# Patient Record
Sex: Female | Born: 1995 | Race: White | Hispanic: No | Marital: Single | Smoking: Never smoker
Health system: Southern US, Community
[De-identification: ages and names within clinical notes are randomized; demographics above are authoritative.]

## PROBLEM LIST (undated history)

## (undated) HISTORY — PX: WISDOM TOOTH EXTRACTION: SHX21

---

## 2017-02-23 ENCOUNTER — Ambulatory Visit: Admission: RE | Admit: 2017-02-23 | Payer: Self-pay | Source: Ambulatory Visit | Admitting: *Deleted

## 2017-02-23 ENCOUNTER — Other Ambulatory Visit: Payer: Self-pay

## 2017-02-23 ENCOUNTER — Encounter: Payer: Self-pay | Admitting: Emergency Medicine

## 2017-02-23 ENCOUNTER — Emergency Department: Payer: BLUE CROSS/BLUE SHIELD

## 2017-02-23 ENCOUNTER — Emergency Department
Admission: EM | Admit: 2017-02-23 | Discharge: 2017-02-23 | Disposition: A | Discharge status: Home / Self Care | Payer: BLUE CROSS/BLUE SHIELD | Attending: Emergency Medicine | Admitting: Emergency Medicine

## 2017-02-23 DIAGNOSIS — R1031 Right lower quadrant pain: Secondary | ICD-10-CM

## 2017-02-23 DIAGNOSIS — K529 Noninfective gastroenteritis and colitis, unspecified: Secondary | ICD-10-CM

## 2017-02-23 LAB — CBC
HCT: 41.2 % (ref 35.0–47.0)
HEMOGLOBIN: 13.8 g/dL (ref 12.0–16.0)
MCH: 30.2 pg (ref 26.0–34.0)
MCHC: 33.5 g/dL (ref 32.0–36.0)
MCV: 90 fL (ref 80.0–100.0)
PLATELETS: 274 10*3/uL (ref 150–440)
RBC: 4.58 MIL/uL (ref 3.80–5.20)
RDW: 13.8 % (ref 11.5–14.5)
WBC: 11.7 10*3/uL — ABNORMAL HIGH (ref 3.6–11.0)

## 2017-02-23 LAB — URINALYSIS, COMPLETE (UACMP) WITH MICROSCOPIC
Bilirubin Urine: NEGATIVE
Glucose, UA: NEGATIVE mg/dL
Ketones, ur: NEGATIVE mg/dL
Nitrite: NEGATIVE
PROTEIN: NEGATIVE mg/dL
SPECIFIC GRAVITY, URINE: 1.005 (ref 1.005–1.030)
pH: 8 (ref 5.0–8.0)

## 2017-02-23 LAB — COMPREHENSIVE METABOLIC PANEL
ALBUMIN: 4.7 g/dL (ref 3.5–5.0)
ALK PHOS: 77 U/L (ref 38–126)
ALT: 10 U/L — AB (ref 14–54)
AST: 20 U/L (ref 15–41)
Anion gap: 11 (ref 5–15)
BUN: 9 mg/dL (ref 6–20)
CALCIUM: 9.7 mg/dL (ref 8.9–10.3)
CHLORIDE: 102 mmol/L (ref 101–111)
CO2: 24 mmol/L (ref 22–32)
CREATININE: 0.37 mg/dL — AB (ref 0.44–1.00)
GFR calc non Af Amer: >60 mL/min (ref >60)
GLUCOSE: 86 mg/dL (ref 65–99)
Potassium: 3.8 mmol/L (ref 3.5–5.1)
SODIUM: 137 mmol/L (ref 135–145)
Total Bilirubin: 0.8 mg/dL (ref 0.3–1.2)
Total Protein: 7.7 g/dL (ref 6.5–8.1)

## 2017-02-23 LAB — LIPASE, BLOOD: Lipase: 31 U/L (ref 11–51)

## 2017-02-23 LAB — POCT PREGNANCY, URINE: PREG TEST UR: NEGATIVE

## 2017-02-23 MED ORDER — ONDANSETRON HCL 4 MG PO TABS: 4.0000 mg | ORAL_TABLET | Freq: Three times a day (TID) | ORAL | 0 refills | Status: AC | PRN

## 2017-02-23 MED ORDER — PIPERACILLIN-TAZOBACTAM 3.375 G IVPB
INTRAVENOUS | Status: AC
  Administered 2017-02-23: 3.375 g via INTRAVENOUS
  Filled 2017-02-23: qty 50

## 2017-02-23 MED ORDER — METRONIDAZOLE 500 MG PO TABS: 500.0000 mg | ORAL_TABLET | Freq: Three times a day (TID) | ORAL | 0 refills | Status: AC

## 2017-02-23 MED ORDER — MORPHINE SULFATE (PF) 2 MG/ML IV SOLN
2.0000 mg | Freq: Once | INTRAVENOUS | Status: DC
  Filled 2017-02-23: qty 1

## 2017-02-23 MED ORDER — KETOROLAC TROMETHAMINE 30 MG/ML IJ SOLN
INTRAMUSCULAR | Status: AC
  Administered 2017-02-23: 15 mg via INTRAVENOUS
  Filled 2017-02-23: qty 1

## 2017-02-23 MED ORDER — IOPAMIDOL (ISOVUE-300) INJECTION 61%
30.0000 mL | Freq: Once | INTRAVENOUS | Status: AC | PRN
  Administered 2017-02-23: 30 mL via ORAL
  Filled 2017-02-23: qty 30

## 2017-02-23 MED ORDER — ONDANSETRON HCL 4 MG/2ML IJ SOLN
4.0000 mg | Freq: Once | INTRAMUSCULAR | Status: AC
  Administered 2017-02-23: 4 mg via INTRAVENOUS
  Filled 2017-02-23: qty 2

## 2017-02-23 MED ORDER — PIPERACILLIN-TAZOBACTAM 3.375 G IVPB 30 MIN
3.3750 g | Freq: Once | INTRAVENOUS | Status: AC
  Administered 2017-02-23: 3.375 g via INTRAVENOUS

## 2017-02-23 MED ORDER — SODIUM CHLORIDE 0.9 % IV BOLUS (SEPSIS)
1000.0000 mL | Freq: Once | INTRAVENOUS | Status: AC
  Administered 2017-02-23: 1000 mL via INTRAVENOUS

## 2017-02-23 MED ORDER — KETOROLAC TROMETHAMINE 30 MG/ML IJ SOLN
15.0000 mg | Freq: Once | INTRAMUSCULAR | Status: AC
  Administered 2017-02-23: 15 mg via INTRAVENOUS

## 2017-02-23 MED ORDER — IOPAMIDOL (ISOVUE-300) INJECTION 61%
75.0000 mL | Freq: Once | INTRAVENOUS | Status: AC | PRN
Start: 2017-02-23 — End: 2017-02-23
  Administered 2017-02-23: 75 mL via INTRAVENOUS
  Filled 2017-02-23: qty 75

## 2017-02-23 MED ORDER — AMOXICILLIN-POT CLAVULANATE 875-125 MG PO TABS: 1.0000 | ORAL_TABLET | Freq: Two times a day (BID) | ORAL | 0 refills | Status: AC

## 2017-02-23 NOTE — ED Notes (Signed)
Pt reports pain is still "not bad enough to need morphine." Will continue to monitor.

## 2017-02-23 NOTE — ED Provider Notes (Addendum)
-----------------------------------------   5:47 PM on 02/23/2017 -----------------------------------------   Blood pressure 125/82, pulse 71, temperature 98.2 F (36.8 C), temperature source Oral, resp. rate 18, height 5\' 2"  (1.575 m), weight 49.9 kg (110 lb), last menstrual period 02/18/2017, SpO2 97 %.  Assuming care from Dr. Shaune PollackLord of [place] ParmaStephanie Reyes is a 22 y.o. female with a chief complaint of Abdominal Pain .    Please refer to H&P by previous MD for further details.  The current plan of care is to f/u results of CT scan.  CT Abdomen Pelvis W Contrast (Final result)  Result time 02/23/17 17:34:08  Final result by Oley BalmHassell, Daniel, MD (02/23/17 17:34:08)           Narrative:   CLINICAL DATA: Patient complains of RLQ pain since this AM. Mild nausea, pain mod 5/10 at rlq. Never had this before. No prior hx of ovarian cyst. No vaginal discharge. Period ended yesterday.  EXAM: CT ABDOMEN AND PELVIS WITH CONTRAST  TECHNIQUE: Multidetector CT imaging of the abdomen and pelvis was performed using the standard protocol following bolus administration of intravenous contrast.  CONTRAST: 75mL ISOVUE-300 IOPAMIDOL (ISOVUE-300) INJECTION 61%  COMPARISON: None.  FINDINGS: Lower chest: No acute abnormality.  Hepatobiliary: No focal liver abnormality is seen. No gallstones, gallbladder wall thickening, or biliary dilatation.  Pancreas: Unremarkable. No pancreatic ductal dilatation or surrounding inflammatory changes.  Spleen: Normal in size without focal abnormality.  Adrenals/Urinary Tract: Adrenal glands are unremarkable. Kidneys are normal, without renal calculi, focal lesion, or hydronephrosis. Bladder is unremarkable.  Stomach/Bowel: Stomach is incompletely distended. Small bowel is nondilated. Terminal ileum unremarkable. Normal appendix. There is eccentric wall thickening in the proximal ascending colon with some surrounding inflammatory/edematous change. No  evidence of abscess. The remainder of the colon is unremarkable.  Vascular/Lymphatic: No significant vascular findings are present. No enlarged abdominal or pelvic lymph nodes.  Reproductive: Uterus and bilateral adnexa are unremarkable.  Other: Trace pelvic ascites probably physiologic. No free air.  Musculoskeletal: No acute or significant osseous findings.  IMPRESSION: 1. Focal eccentric wall thickening involving proximal ascending colon with adjacent inflammatory/edematous change. Considerations include inflammatory bowel disease, infectious/inflammatory process, less likely neoplasm. Consider colonoscopy if symptoms persist. 2. Normal appendix.         Patient denies personal or family history of IBD or colon cancer. Labs with mild leukocytosis, vitals WNL. Patient's pain 4/10. Will give zosyn and dc on flagyl and augmentin PO with close f/u with PCP. Patient being referred to GI for further evaluation. Return precautions for worsening signs of infection have been discussed with patient. Patient reports that she is going on a road trip tomorrow. I explained to her that if those symptoms develop she needs to seek emergent care at the closest ER. Patient understands instructions and is comfortable with plan.          Don PerkingVeronese, [place], MD 02/23/17 1751    Nita SickleVeronese, [place], MD 02/23/17 [phone]

## 2017-02-23 NOTE — ED Notes (Signed)
ED Provider at bedside. 

## 2017-02-23 NOTE — ED Notes (Signed)
Patient transported to CT 

## 2017-02-23 NOTE — Discharge Instructions (Signed)
Your CT scan showed inflammation of your colon which could be due to infection, a inflammatory process such as Crohns or ulcerative colitis, or less likely cancer. Please take the antibiotics prescribed fully even if your symptoms resolve in a few days. You need to follow up with a GI doctor for further evaluation of this inflamed area. I am giving you the contact of Dr. Maximino Greenlandahiliani. You need an appointment with her after you finished the course of antibiotics.  Return to the ER for worsening pain, nausea, vomiting, fever, or if your symptoms are not improving after 48 hours of antibiotics.

## 2017-02-23 NOTE — ED Triage Notes (Signed)
Lower R abd pain since this am.

## 2017-02-23 NOTE — ED Provider Notes (Signed)
[facility] Emergency Department Provider Note ____________________________________________   I have reviewed the triage vital signs and the triage nursing note.  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  Mild nausea, pain mod 5/10 at rlq.  Never had this before.  No prior hx of ovarian cyst.  No vaginal discharge.  Period ended yesterday.  No fever. Mild nausea without vomiting.  No diarrhea.  Movement seems to make it worse.    History reviewed. No pertinent past medical history.  There are no active problems to display for this patient.   History reviewed. No pertinent surgical history.  Prior to Admission medications   Not on File    No Known Allergies  No family history on file.  Social History Social History   Tobacco Use  . Smoking status: Never Smoker  Substance Use Topics  . Alcohol use: Not on file  . Drug use: Not on file    Review of Systems  Constitutional: Negative for fever. Eyes: Negative for visual changes. ENT: Negative for sore throat. Cardiovascular: Negative for chest pain. Respiratory: Negative for shortness of breath. Gastrointestinal: Positive for right lower quadrant abdominal pain as per HPI.Marland Kitchen Genitourinary: Negative for dysuria. Musculoskeletal: Negative for back pain. Skin: Negative for rash. Neurological: Negative for headache.  ____________________________________________   PHYSICAL EXAM:  VITAL SIGNS: ED Triage Vitals  Enc Vitals Group     BP 02/23/17 1309 113/71     Pulse Rate 02/23/17 1309 77     Resp 02/23/17 1309 (!) 22     Temp 02/23/17 1309 97.9 F (36.6 C)     Temp Source 02/23/17 1309 Oral     SpO2 02/23/17 1309 100 %     Weight 02/23/17 1310 110 lb (49.9 kg)     Height 02/23/17 1310 5\' 2"  (1.575 m)     Head Circumference --      Peak Flow --    Pain Score 02/23/17 1328 5     Pain Loc --      Pain Edu? --      Excl. in GC? --      Constitutional: Alert and oriented. Well appearing and in no distress. HEENT   Head: Normocephalic and atraumatic.      Eyes: Conjunctivae are normal. Pupils equal and round.       Ears:         Nose: No congestion/rhinnorhea.   Mouth/Throat: Mucous membranes are moist.   Neck: No stridor. Cardiovascular/Chest: Normal rate, regular rhythm.  No murmurs, rubs, or gallops. Respiratory: Normal respiratory effort without tachypnea nor retractions. Breath sounds are clear and equal bilaterally. No wheezes/rales/rhonchi. Gastrointestinal: Soft. No distention, no rebound.  Moderate right lower quadrant tenderness to palpation with guarding. Genitourinary/rectal:Deferred Musculoskeletal: Nontender with normal range of motion in all extremities. No joint effusions.  No lower extremity tenderness.  No edema. Neurologic:  Normal speech and language. No gross or focal neurologic deficits are appreciated. Skin:  Skin is warm, dry and intact. No rash noted. Psychiatric: Mood and affect are normal. Speech and behavior are normal. Patient exhibits appropriate insight and judgment.   ____________________________________________  LABS (pertinent positives/negatives) I, Governor Rooks, MD the attending physician have reviewed the labs noted below.  Labs Reviewed  COMPREHENSIVE METABOLIC PANEL - Abnormal; Notable for the following components:      Result Value   Creatinine,  Ser 0.37 (*)    ALT 10 (*)    All other components within normal limits  CBC - Abnormal; Notable for the following components:   WBC 11.7 (*)    All other components within normal limits  URINALYSIS, COMPLETE (UACMP) WITH MICROSCOPIC - Abnormal; Notable for the following components:   Color, Urine STRAW (*)    APPearance CLEAR (*)    Hgb urine dipstick MODERATE (*)    Leukocytes, UA LARGE (*)    Bacteria, UA RARE (*)    Squamous  Epithelial / LPF 0-5 (*)    All other components within normal limits  URINE CULTURE  LIPASE, BLOOD  POC URINE PREG, ED  POCT PREGNANCY, URINE     ____________________________________________  RADIOLOGY All Xrays were viewed by me.  Imaging interpreted by Radiologist, and I, Governor Rooksebecca Dayelin Balducci, MD the attending physician have reviewed the radiologist interpretation noted below.  CT abdomen pelvis with contrast: Pending __________________________________________  PROCEDURES  Procedure(s) performed: None  Critical Care performed: None   ____________________________________________  ED COURSE / ASSESSMENT AND PLAN  Pertinent labs & imaging results that were available during my care of the patient were reviewed by me and considered in my medical decision making (see chart for details).   Highly suspicious clinically for acute appendicitis, discussed with patient, discussed risk versus benefit with radiation exposure for CT scan and chose to proceed for evaluation of acute appendicitis.  No vaginal complaints, holding off on pelvic exam given high suspicion for acute appendicitis.  Patient care transferred to Dr. Don PerkingVeronese, dispo pending ct result.  DIFFERENTIAL DIAGNOSIS: Differential diagnosis includes, but is not limited to, ovarian cyst, ovarian torsion, acute appendicitis, diverticulitis, urinary tract infection/pyelonephritis, endometriosis, bowel obstruction, colitis, renal colic, gastroenteritis, hernia, fibroids, endometriosis, pregnancy related pain including ectopic pregnancy, etc.    Patient / Family / Caregiver informed of clinical course, medical decision-making process, and agree with plan.   ___________________________________________   FINAL CLINICAL IMPRESSION(S) / ED DIAGNOSES   Final diagnoses:  RLQ abdominal pain      ___________________________________________        Note: This dictation was prepared with Dragon dictation. Any  transcriptional errors that result from this process are unintentional    Governor RooksLord, Nyana Haren, MD 02/23/17 [phone]

## 2017-02-23 NOTE — ED Notes (Signed)
Pt ambulatory upon discharge. Verbalized understanding of discharge instructions, follow-up care and prescriptions. VSS. Skin warm and dry. A&O x4.  

## 2017-02-25 LAB — URINE CULTURE: Culture: <10000 — AB

## 2017-03-09 ENCOUNTER — Ambulatory Visit (INDEPENDENT_AMBULATORY_CARE_PROVIDER_SITE_OTHER): Payer: BLUE CROSS/BLUE SHIELD | Admitting: Gastroenterology

## 2017-03-09 ENCOUNTER — Encounter: Payer: Self-pay | Admitting: Gastroenterology

## 2017-03-09 VITALS — BP 119/85 | HR 86 | Temp 98.1°F | Ht 62.0 in | Wt 114.0 lb

## 2017-03-09 DIAGNOSIS — R1031 Right lower quadrant pain: Secondary | ICD-10-CM

## 2017-03-09 DIAGNOSIS — R933 Abnormal findings on diagnostic imaging of other parts of digestive tract: Secondary | ICD-10-CM | POA: Diagnosis not present

## 2017-03-09 NOTE — Progress Notes (Signed)
Arlyss Repress, MD [address]  [address]  [place], [place] [address]  Main: [phone]  Fax: [phone]    Gastroenterology Consultation  Referring Provider:     No ref. provider found Primary Care Physician:  Patient, No Pcp Per Primary Gastroenterologist:  Dr. Arlyss Repress Reason for Consultation:     RLQ pain        HPI:   Teresa Reyes is a 22 y.o. female referred from ER for consultation & management of acute episode of RLQ pain. It was sudden onset,moderate in intensity, sharp and a/w nausea. Went to ER on 02/23/17. Mild rise in WBC count. Lipase, Hb, CMP, preg test normal. CT A/P with con revealed thickening of AC. No evidence of acute appendicitis. She was discharged on Augmentin and flagyl for 10days. She denies any problems with menstruation   NSAIDs: None  Antiplts/Anticoagulants/Anti thrombotics: None  GI Procedures: None Fam history of colon cancer in maternal 2nd degree and 3rd degree relatives.  No abdominal surgeries   History reviewed. No pertinent past medical history.  History reviewed. No pertinent surgical history.  Prior to Admission medications   Medication Sig Start Date End Date Taking? Authorizing Provider  ondansetron (ZOFRAN) 4 MG tablet Take 1 tablet (4 mg total) by mouth every 8 (eight) hours as needed for nausea or vomiting. 02/23/17   Nita Sickle, MD    History reviewed. No pertinent family history.   Social History   Tobacco Use  . Smoking status: Never Smoker  . Smokeless tobacco: Never Used  Substance Use Topics  . Alcohol use: No    Frequency: Never  . Drug use: No    Allergies as of 03/09/2017  . (No Known Allergies)    Review of Systems:    All systems reviewed and negative except where noted in HPI.   Physical Exam:  BP 119/85   Pulse 86   Temp 98.1 F (36.7 C) (Oral)   Ht 5\' 2"  (1.575 m)   Wt 114 lb (51.7 kg)   LMP 02/18/2017 Comment: neg preg test 02/23/17  BMI 20.85 kg/m  Patient's  last menstrual period was 02/18/2017.  General:   Alert,  Well-developed, well-nourished, pleasant and cooperative in NAD Head:  Normocephalic and atraumatic. Eyes:  Sclera clear, no icterus.   Conjunctiva pink. Ears:  Normal auditory acuity. Nose:  No deformity, discharge, or lesions. Mouth:  No deformity or lesions,oropharynx pink & moist. Neck:  Supple; no masses or thyromegaly. Lungs:  Respirations even and unlabored.  Clear throughout to auscultation.   No wheezes, crackles, or rhonchi. No acute distress. Heart:  Regular rate and rhythm; no murmurs, clicks, rubs, or gallops. Abdomen:  Normal bowel sounds. Soft, non-tender and non-distended without masses, hepatosplenomegaly or hernias noted.  No guarding or rebound tenderness.   Rectal: Not performed Msk:  Symmetrical without gross deformities. Good, equal movement & strength bilaterally. Pulses:  Normal pulses noted. Extremities:  No clubbing or edema.  No cyanosis. Neurologic:  Alert and oriented x3;  grossly normal neurologically. Skin:  Intact without significant lesions or rashes. No jaundice. Lymph Nodes:  No significant cervical adenopathy. Psych:  Alert and cooperative. Normal mood and affect.  Imaging Studies: Reviewed  Assessment and Plan:   Teresa Reyes is a 22 y.o. female with no significant past medical history, acute episode of right lower quadrant pain, CT showed thickening in the ascending colon and no evidence of acute appendicitis  - Given her family history, I recommend performing colonoscopy  not only to evaluate for colon cancer but also for Crohn's disease  I have discussed alternative options, risks & benefits,  which include, but are not limited to, bleeding, infection, perforation,respiratory complication & drug reaction.  The patient agrees with this plan & written consent will be obtained.    Follow up in clinic based on the colonoscopy results   Arlyss Repressohini R Huxley Vanwagoner, MD

## 2017-03-17 ENCOUNTER — Telehealth: Payer: Self-pay | Admitting: Gastroenterology

## 2017-03-17 NOTE — Telephone Encounter (Signed)
Patient never received prepping information. Please call the mother Bonita QuinLinda at [phone]. Procedure is tomorrow with Dr. Allegra LaiVanga

## 2017-03-17 NOTE — Discharge Instructions (Signed)
General Anesthesia, Adult, Care After °These instructions provide you with information about caring for yourself after your procedure. Your health care provider may also give you more specific instructions. Your treatment has been planned according to current medical practices, but problems sometimes occur. Call your health care provider if you have any problems or questions after your procedure. °What can I expect after the procedure? °After the procedure, it is common to have: °· Vomiting. °· A sore throat. °· Mental slowness. ° °It is common to feel: °· Nauseous. °· Cold or shivery. °· Sleepy. °· Tired. °· Sore or achy, even in parts of your body where you did not have surgery. ° °Follow these instructions at home: °For at least 24 hours after the procedure: °· Do not: °? Participate in activities where you could fall or become injured. °? Drive. °? Use heavy machinery. °? Drink alcohol. °? Take sleeping pills or medicines that cause drowsiness. °? Make important decisions or sign legal documents. °? Take care of children on your own. °· Rest. °Eating and drinking °· If you vomit, drink water, juice, or soup when you can drink without vomiting. °· Drink enough fluid to keep your urine clear or pale yellow. °· Make sure you have little or no nausea before eating solid foods. °· Follow the diet recommended by your health care provider. °General instructions °· Have a responsible adult stay with you until you are awake and alert. °· Return to your normal activities as told by your health care provider. Ask your health care provider what activities are safe for you. °· Take over-the-counter and prescription medicines only as told by your health care provider. °· If you smoke, do not smoke without supervision. °· Keep all follow-up visits as told by your health care provider. This is important. °Contact a health care provider if: °· You continue to have nausea or vomiting at home, and medicines are not helpful. °· You  cannot drink fluids or start eating again. °· You cannot urinate after 8-12 hours. °· You develop a skin rash. °· You have fever. °· You have increasing redness at the site of your procedure. °Get help right away if: °· You have difficulty breathing. °· You have chest pain. °· You have unexpected bleeding. °· You feel that you are having a life-threatening or urgent problem. °This information is not intended to replace advice given to you by your health care provider. Make sure you discuss any questions you have with your health care provider. °Document Released: 04/28/2000 Document Revised: 06/25/2015 Document Reviewed: 01/04/2015 °Elsevier Interactive Patient Education © 2018 [employer] ° °

## 2017-03-18 ENCOUNTER — Ambulatory Visit: Payer: BLUE CROSS/BLUE SHIELD | Admitting: Anesthesiology

## 2017-03-18 ENCOUNTER — Encounter
Admission: RE | Disposition: A | Discharge status: Home / Self Care | Payer: Self-pay | Source: Ambulatory Visit | Attending: Gastroenterology

## 2017-03-18 ENCOUNTER — Ambulatory Visit
Admission: RE | Admit: 2017-03-18 | Discharge: 2017-03-18 | Disposition: A | Discharge status: Home / Self Care | Payer: BLUE CROSS/BLUE SHIELD | Source: Ambulatory Visit | Attending: Gastroenterology | Admitting: Gastroenterology

## 2017-03-18 DIAGNOSIS — Z800 Family history of malignant neoplasm of digestive organs: Secondary | ICD-10-CM | POA: Diagnosis not present

## 2017-03-18 DIAGNOSIS — R933 Abnormal findings on diagnostic imaging of other parts of digestive tract: Secondary | ICD-10-CM | POA: Diagnosis not present

## 2017-03-18 DIAGNOSIS — R1031 Right lower quadrant pain: Secondary | ICD-10-CM

## 2017-03-18 DIAGNOSIS — Z79899 Other long term (current) drug therapy: Secondary | ICD-10-CM | POA: Insufficient documentation

## 2017-03-18 HISTORY — PX: COLONOSCOPY WITH PROPOFOL: SHX5780

## 2017-03-18 SURGERY — COLONOSCOPY WITH PROPOFOL
Anesthesia: General | Site: Rectum | Wound class: Contaminated

## 2017-03-18 MED ORDER — LIDOCAINE HCL (CARDIAC) 20 MG/ML IV SOLN
INTRAVENOUS | Status: DC | PRN
  Administered 2017-03-18: 40 mg via INTRAVENOUS

## 2017-03-18 MED ORDER — PROPOFOL 10 MG/ML IV BOLUS
INTRAVENOUS | Status: DC | PRN
  Administered 2017-03-18 (×2): 20 mg via INTRAVENOUS
  Administered 2017-03-18: 70 mg via INTRAVENOUS
  Administered 2017-03-18: 30 mg via INTRAVENOUS
  Administered 2017-03-18: 20 mg via INTRAVENOUS
  Administered 2017-03-18 (×2): 30 mg via INTRAVENOUS

## 2017-03-18 MED ORDER — STERILE WATER FOR IRRIGATION IR SOLN
Status: DC | PRN
  Administered 2017-03-18: .5 mL

## 2017-03-18 MED ORDER — LACTATED RINGERS IV SOLN
10.0000 mL/h | INTRAVENOUS | Status: DC
  Administered 2017-03-18: 10:00:00 via INTRAVENOUS

## 2017-03-18 SURGICAL SUPPLY — 5 items
CANISTER SUCT 1200ML W/VALVE (MISCELLANEOUS) ×3 IMPLANT
GOWN CVR UNV OPN BCK APRN NK (MISCELLANEOUS) ×2 IMPLANT
GOWN ISOL THUMB LOOP REG UNIV (MISCELLANEOUS) ×4
KIT ENDO PROCEDURE OLY (KITS) ×3 IMPLANT
WATER STERILE IRR 250ML POUR (IV SOLUTION) ×3 IMPLANT

## 2017-03-18 NOTE — H&P (Signed)
  Carren Blakley R Demetres Prochnow, MD [address]  [address]  [place], [place] [address]  Main: [phone]  Fax: [phone] Pager: [phone]  Primary Care Physician:  System, Provider Not In Primary Gastroenterologist:  Dr. Arlyss Repressohini R Jadae Steinke  Pre-Procedure History & Physical: HPI:  Teresa Reyes is a 22 y.o. female is here for an colonoscopy.   History reviewed. No pertinent past medical history.  Past Surgical History:  Procedure Laterality Date  . WISDOM TOOTH EXTRACTION      Prior to Admission medications   Medication Sig Start Date End Date Taking? Authorizing Provider  ibuprofen (ADVIL,MOTRIN) 200 MG tablet Take 200 mg by mouth every 6 (six) hours as needed.   Yes [provider]  ondansetron (ZOFRAN) 4 MG tablet Take 1 tablet (4 mg total) by mouth every 8 (eight) hours as needed for nausea or vomiting. Patient not taking: Reported on 03/09/2017 02/23/17   Nita SickleVeronese, [place], MD    Allergies as of 03/09/2017  . (No Known Allergies)    History reviewed. No pertinent family history.  Social History   Socioeconomic History  . Marital status: Single    Spouse name: Not on file  . Number of children: Not on file  . Years of education: Not on file  . Highest education level: Not on file  Social Needs  . Financial resource strain: Not on file  . Food insecurity - worry: Not on file  . Food insecurity - inability: Not on file  . Transportation needs - medical: Not on file  . Transportation needs - non-medical: Not on file  Occupational History  . Not on file  Tobacco Use  . Smoking status: Never Smoker  . Smokeless tobacco: Never Used  Substance and Sexual Activity  . Alcohol use: No    Frequency: Never  . Drug use: No  . Sexual activity: Not on file  Other Topics Concern  . Not on file  Social History Narrative  . Not on file    Review of Systems: See HPI, otherwise negative ROS  Physical Exam: BP 111/90   Pulse 90   Temp 98.2 F (36.8 C)  (Temporal)   Ht 5\' 2"  (1.575 m)   Wt 110 lb (49.9 kg)   LMP 02/17/2017 Comment: preg Test negative 03/18/17  SpO2 99%   BMI 20.12 kg/m  General:   Alert,  pleasant and cooperative in NAD Head:  Normocephalic and atraumatic. Neck:  Supple; no masses or thyromegaly. Lungs:  Clear throughout to auscultation.    Heart:  Regular rate and rhythm. Abdomen:  Soft, nontender and nondistended. Normal bowel sounds, without guarding, and without rebound.   Neurologic:  Alert and  oriented x4;  grossly normal neurologically.  Impression/Plan: Teresa Reyes is here for an colonoscopy to be performed for RLQ pain, fam h/o colon cancer, abnormal ct  Risks, benefits, limitations, and alternatives regarding  colonoscopy have been reviewed with the patient.  Questions have been answered.  All parties agreeable.   Lannette Donathohini Kelce Bouton, MD  03/18/2017, 9:33 AM

## 2017-03-18 NOTE — Transfer of Care (Signed)
Immediate Anesthesia Transfer of Care Note  Patient: Teresa ParmaStephanie Cabana  Procedure(s) Performed: COLONOSCOPY WITH PROPOFOL (N/A Rectum)  Patient Location: PACU  Anesthesia Type: General  Level of Consciousness: awake, alert  and patient cooperative  Airway and Oxygen Therapy: Patient Spontanous Breathing and Patient connected to supplemental oxygen  Post-op Assessment: Post-op Vital signs reviewed, Patient's Cardiovascular Status Stable, Respiratory Function Stable, Patent Airway and No signs of Nausea or vomiting  Post-op Vital Signs: Reviewed and stable  Complications: No apparent anesthesia complications

## 2017-03-18 NOTE — Anesthesia Postprocedure Evaluation (Signed)
Anesthesia Post Note  Patient: Teresa Reyes  Procedure(s) Performed: COLONOSCOPY WITH PROPOFOL (N/A Rectum)  Patient location during evaluation: PACU Anesthesia Type: General Level of consciousness: awake and alert Pain management: pain level controlled Vital Signs Assessment: post-procedure vital signs reviewed and stable Respiratory status: spontaneous breathing, nonlabored ventilation, respiratory function stable and patient connected to nasal cannula oxygen Cardiovascular status: blood pressure returned to baseline and stable Postop Assessment: no apparent nausea or vomiting Anesthetic complications: no    SCOURAS, NICOLE ELAINE

## 2017-03-18 NOTE — Op Note (Signed)
[facility] ([facility]) Gastroenterology Patient Name: Teresa Reyes Procedure Date: 03/18/2017 10:01 AM MRN: [id number] Account #: [id number] Date of Birth: [birth year] Admit Type: Outpatient Age: 22 Room: [facility] OR ROOM 01 Gender: Female Note Status: Finalized Procedure:            Colonoscopy Indications:          Abdominal pain in the right lower quadrant, Abnormal CT                        of the GI tract Providers:            Lin Landsman MD, MD Medicines:            Monitored Anesthesia Care Complications:        No immediate complications. Estimated blood loss: None. Procedure:            Pre-Anesthesia Assessment:                       - Prior to the procedure, a History and Physical was                        performed, and patient medications and allergies were                        reviewed. The patient is competent. The risks and                        benefits of the procedure and the sedation options and                        risks were discussed with the patient. All questions                        were answered and informed consent was obtained.                        Patient identification and proposed procedure were                        verified by the physician, the nurse, the                        anesthesiologist, the anesthetist and the technician in                        the pre-procedure area in the procedure room. Mental                        Status Examination: alert and oriented. Airway                        Examination: normal oropharyngeal airway and neck                        mobility. Respiratory Examination: clear to                        auscultation. CV Examination: normal. Prophylactic  Antibiotics: The patient does not require prophylactic                        antibiotics. Prior Anticoagulants: The patient has                        taken no previous anticoagulant or antiplatelet agents.               ASA Grade Assessment: I - A normal, healthy patient.                        After reviewing the risks and benefits, the patient was                        deemed in satisfactory condition to undergo the                        procedure. The anesthesia plan was to use monitored                        anesthesia care (MAC). Immediately prior to                        administration of medications, the patient was                        re-assessed for adequacy to receive sedatives. The                        heart rate, respiratory rate, oxygen saturations, blood                        pressure, adequacy of pulmonary ventilation, and                        response to care were monitored throughout the                        procedure. The physical status of the patient was                        re-assessed after the procedure.                       After obtaining informed consent, the colonoscope was                        passed under direct vision. Throughout the procedure,                        the patient's blood pressure, pulse, and oxygen                        saturations were monitored continuously. The Wink [id number]) was introduced through the                        anus and advanced to the the terminal ileum. The  colonoscopy was performed without difficulty. The                        patient tolerated the procedure well. The quality of                        the bowel preparation was evaluated using the BBPS                        [facility] Bowel Preparation Scale) with scores of: Right                        Colon = 3, Transverse Colon = 3 and Left Colon = 3                        (entire mucosa seen well with no residual staining,                        small fragments of stool or opaque liquid). The total                        BBPS score equals 9. Findings:      The perianal and digital rectal  examinations were normal. Pertinent       negatives include normal sphincter tone and no palpable rectal lesions.      The terminal ileum appeared normal.      The colon (entire examined portion) appeared normal.      The retroflexed view of the distal rectum and anal verge was normal and       showed no anal or rectal abnormalities. Impression:           - The examined portion of the ileum was normal.                       - The entire examined colon is normal.                       - The distal rectum and anal verge are normal on                        retroflexion view.                       - No specimens collected. Recommendation:       - Discharge patient to home.                       - Resume regular diet today. Procedure Code(s):    --- Professional ---                       [phone], Colonoscopy, flexible; diagnostic, including                        collection of specimen(s) by brushing or washing, when                        performed (separate procedure) Diagnosis Code(s):    --- Professional ---  R10.31, Right lower quadrant pain                       R93.3, Abnormal findings on diagnostic imaging of other                        parts of digestive tract CPT copyright 2016 American Medical Association. All rights reserved. The codes documented in this report are preliminary and upon coder review may  be revised to meet current compliance requirements. Dr. Ulyess Mort Lin Landsman MD, MD 03/18/2017 10:38:57 AM This report has been signed electronically. Number of Addenda: 0 Note Initiated On: 03/18/2017 10:01 AM Scope Withdrawal Time: 0 hours 6 minutes 50 seconds  Total Procedure Duration: 0 hours 12 minutes 32 seconds       [facility]

## 2017-03-18 NOTE — Anesthesia Preprocedure Evaluation (Signed)
Anesthesia Evaluation  Patient identified by MRN, date of birth, ID band Patient awake    Reviewed: Allergy & Precautions, H&P , NPO status , Patient's Chart, lab work & pertinent test results, reviewed documented beta blocker date and time   Airway Mallampati: II  TM Distance: >3 FB Neck ROM: full    Dental no notable dental hx.    Pulmonary neg pulmonary ROS,    Pulmonary exam normal breath sounds clear to auscultation       Cardiovascular Exercise Tolerance: Good negative cardio ROS   Rhythm:regular Rate:Normal     Neuro/Psych negative neurological ROS  negative psych ROS   GI/Hepatic Neg liver ROS, Abdominal pain   Endo/Other  negative endocrine ROS  Renal/GU negative Renal ROS  negative genitourinary   Musculoskeletal   Abdominal   Peds  Hematology negative hematology ROS (+)   Anesthesia Other Findings   Reproductive/Obstetrics negative OB ROS                             Anesthesia Physical Anesthesia Plan  ASA: II  Anesthesia Plan: General   Post-op Pain Management:    Induction:   PONV Risk Score and Plan:   Airway Management Planned:   Additional Equipment:   Intra-op Plan:   Post-operative Plan:   Informed Consent: I have reviewed the patients History and Physical, chart, labs and discussed the procedure including the risks, benefits and alternatives for the proposed anesthesia with the patient or authorized representative who has indicated his/her understanding and acceptance.   Dental Advisory Given  Plan Discussed with: CRNA  Anesthesia Plan Comments:         Anesthesia Quick Evaluation

## 2017-03-18 NOTE — Anesthesia Procedure Notes (Signed)
Procedure Name: MAC Date/Time: 03/18/2017 10:07 AM Performed by: Janna Arch, CRNA Pre-anesthesia Checklist: Patient identified, Emergency Drugs available, Suction available and Patient being monitored Patient Re-evaluated:Patient Re-evaluated prior to induction Oxygen Delivery Method: Nasal cannula

## 2017-04-17 ENCOUNTER — Encounter (INDEPENDENT_AMBULATORY_CARE_PROVIDER_SITE_OTHER): Payer: Self-pay | Admitting: Vascular Surgery

## 2017-04-17 ENCOUNTER — Ambulatory Visit (INDEPENDENT_AMBULATORY_CARE_PROVIDER_SITE_OTHER): Payer: BLUE CROSS/BLUE SHIELD | Admitting: Vascular Surgery

## 2017-04-17 VITALS — BP 124/82 | HR 78 | Resp 17 | Ht 62.0 in | Wt 120.0 lb

## 2017-04-17 DIAGNOSIS — I7300 Raynaud's syndrome without gangrene: Secondary | ICD-10-CM | POA: Diagnosis not present

## 2017-04-17 NOTE — Patient Instructions (Signed)
Raynaud Phenomenon Raynaud phenomenon is a condition that affects the blood vessels (arteries) that carry blood to your fingers and toes. The arteries that supply blood to your ears or the tip of your nose might also be affected. Raynaud phenomenon causes the arteries to temporarily narrow. As a result, the flow of blood to the affected areas is temporarily decreased. This usually occurs in response to cold temperatures or stress. During an attack, the skin in the affected areas turns white. You may also feel tingling or numbness in those areas. Attacks usually last for only a brief period, and then the blood flow to the area returns to normal. In most cases, Raynaud phenomenon does not cause serious health problems. What are the causes? For many people with this condition, the cause is not known. Raynaud phenomenon is sometimes associated with other diseases, such as scleroderma or lupus. What increases the risk? Raynaud phenomenon can affect anyone, but it develops most often in people who are 20-40 years old. It affects more females than males. What are the signs or symptoms? Symptoms of Raynaud phenomenon may occur when you are exposed to cold temperatures or when you have emotional stress. The symptoms may last for a few minutes or up to several hours. They usually affect your fingers but may also affect your toes, ears, or the tip of your nose. Symptoms may include:  Changes in skin color. The skin in the affected areas will turn pale or white. The skin may then change from white to bluish to red as normal blood flow returns to the area.  Numbness, tingling, or pain in the affected areas.  In severe cases, sores may develop in the affected areas. How is this diagnosed? Your health care provider will do a physical exam and take your medical history. You may be asked to put your hands in cold water to check for a reaction to cold temperature. Blood tests may be done to check for other diseases or  conditions. Your health care provider may also order a test to check the movement of blood through your arteries and veins (vascular ultrasound). How is this treated? Treatment often involves making lifestyle changes and taking steps to control your exposure to cold temperatures. For more severe cases, medicine (calcium channel blockers) may be used to improve blood flow. Surgery is sometimes done to block the nerves that control the affected arteries, but this is rare. Follow these instructions at home:  Avoid exposure to cold by taking these steps: ? If possible, stay indoors during cold weather. ? When you go outside during cold weather, dress in layers and wear mittens, a hat, a scarf, and warm footwear. ? Wear mittens or gloves when handling ice or frozen food. ? Use holders for glasses or cans containing cold drinks. ? Let warm water run for a while before taking a shower or bath. ? Warm up the car before driving in cold weather.  If possible, avoid stressful and emotional situations. Exercise, meditation, and yoga may help you cope with stress. Biofeedback may be useful.  Do not use any tobacco products, including cigarettes, chewing tobacco, or electronic cigarettes. If you need help quitting, ask your health care provider.  Avoid secondhand smoke.  Limit your use of caffeine. Switch to decaffeinated coffee, tea, and soda. Avoid chocolate.  Wear loose fitting socks and comfortable, roomy shoes.  Avoid vibrating tools and machinery.  Take medicines only as directed by your health care provider. Contact a health care provider if:    Your discomfort becomes worse despite lifestyle changes.  You develop sores on your fingers or toes that do not heal.  Your fingers or toes turn black.  You have breaks in the skin on your fingers or toes.  You have a fever.  You have pain or swelling in your joints.  You have a rash.  Your symptoms occur on only one side of your body. This  information is not intended to replace advice given to you by your health care provider. Make sure you discuss any questions you have with your health care provider. Document Released: 01/18/2000 Document Revised: 06/28/2015 Document Reviewed: 07/25/2015 Elsevier Interactive Patient Education  2017 Elsevier Inc.  

## 2017-04-17 NOTE — Progress Notes (Signed)
Patient ID: Teresa Reyes, female   DOB: 03-23-1995, 22 y.o.   MRN: [id number]  Chief Complaint  Patient presents with  . New Patient (Initial Visit)    bil hand pain with coldness    HPI Teresa Reyes is a 22 y.o. female.  Patient present for evaluation of pain and discoloration in her hands.  This occurs when it is cold outside.  She has noticed over the past several months without clear inciting event or causative factor.  She plays ultimate Frisbee and does wear gloves at times although these gloves do not have fingers in them.  She does not noticed the symptoms when it is warm.  She has no medical issues.  She does not smoke.  Both hands are affected but the right may be a little worse.  Warming them seems to remove the pain and discoloration.  The discoloration has a bluish tinge she says looking somewhat like veins.  Current Outpatient Medications  Medication Sig Dispense Refill  . ibuprofen (ADVIL,MOTRIN) 200 MG tablet Take 200 mg by mouth every 6 (six) hours as needed.    . ondansetron (ZOFRAN) 4 MG tablet Take 1 tablet (4 mg total) by mouth every 8 (eight) hours as needed for nausea or vomiting. (Patient not taking: Reported on 03/09/2017) 20 tablet 0   No current facility-administered medications for this visit.      No past medical history on file.  Past Surgical History:  Procedure Laterality Date  . COLONOSCOPY WITH PROPOFOL N/A 03/18/2017   Procedure: COLONOSCOPY WITH PROPOFOL;  Surgeon: Lin Landsman, MD;  Location: [place];  Service: Endoscopy;  Laterality: N/A;  . WISDOM TOOTH EXTRACTION      Family History No bleeding or clotting disorders  Social History Social History   Tobacco Use  . Smoking status: Never Smoker  . Smokeless tobacco: Never Used  Substance Use Topics  . Alcohol use: No    Frequency: Never  . Drug use: No     No Known Allergies      REVIEW OF SYSTEMS (Negative unless checked)  Constitutional: []Weight  loss  []Fever  []Chills Cardiac: []Chest pain   []Chest pressure   []Palpitations   []Shortness of breath when laying flat   []Shortness of breath at rest   []Shortness of breath with exertion. Vascular:  []Pain in legs with walking   []Pain in legs at rest   []Pain in legs when laying flat   []Claudication   []Pain in feet when walking  []Pain in feet at rest  []Pain in feet when laying flat   []History of DVT   []Phlebitis   []Swelling in legs   []Varicose veins   []Non-healing ulcers Pulmonary:   []Uses home oxygen   []Productive cough   []Hemoptysis   []Wheeze  []COPD   []Asthma Neurologic:  []Dizziness  []Blackouts   []Seizures   []History of stroke   []History of TIA  []Aphasia   []Temporary blindness   []Dysphagia   []Weakness or numbness in arms   []Weakness or numbness in legs Musculoskeletal:  []Arthritis   []Joint swelling   []Joint pain   []Low back pain Hematologic:  []Easy bruising  []Easy bleeding   []Hypercoagulable state   []Anemic  []Hepatitis  Gastrointestinal:  []Blood in stool   []Vomiting blood  []Gastroesophageal reflux/heartburn   []Difficulty swallowing   Genitourinary:  []Chronic kidney disease   []Difficult urination  []Frequent urination  []Burning with urination   []Blood in urine  Skin:  []Rashes   []Ulcers   []Wounds Psychological:  []History of anxiety   [] History of major depression.  Physical Exam BP 124/82 (BP Location: Right Arm)   Pulse 78   Resp 17   Ht 5' 2" (1.575 m)   Wt 54.4 kg (120 lb)   BMI 21.95 kg/m   Gen:  WD/WN, NAD Head: Atkins/AT, No temporalis wasting.  Ear/Nose/Throat: Hearing grossly intact, nares w/o erythema or drainage, oropharynx w/o Erythema/Exudate Eyes: Sclera non-icteric, conjunctiva clear Neck: Trachea midline.  No JVD.  Pulmonary:  Good air movement, no use of accessory muscles.  Cardiac: RRR, no JVD Vascular:  Vessel Right Left  Radial Palpable Palpable                                    Musculoskeletal: M/S 5/5  throughout.  Extremities without ischemic changes.  No deformity or atrophy.  Hands are warm with good capillary refill. Neurologic:  Sensation grossly intact in extremities.  Symmetrical.  Speech is fluent. Motor exam as listed above. Psychiatric: Judgment intact, Mood & affect appropriate for pt's clinical situation. Dermatologic: No rashes or ulcers noted.  No cellulitis or open wounds.  Radiology No results found.  Labs Recent Results (from the past 2160 hour(s))  Lipase, blood     Status: None   Collection Time: 02/23/17  1:25 PM  Result Value Ref Range   Lipase 31 11 - 51 U/L    Comment: Performed at [facility], [address] [place], [place] [address]  Comprehensive metabolic panel     Status: Abnormal   Collection Time: 02/23/17  1:25 PM  Result Value Ref Range   Sodium 137 135 - 145 mmol/L   Potassium 3.8 3.5 - 5.1 mmol/L   Chloride 102 101 - 111 mmol/L   CO2 24 22 - 32 mmol/L   Glucose, Bld 86 65 - 99 mg/dL   BUN 9 6 - 20 mg/dL   Creatinine, Ser 0.37 (L) 0.44 - 1.00 mg/dL   Calcium 9.7 8.9 - 10.3 mg/dL   Total Protein 7.7 6.5 - 8.1 g/dL   Albumin 4.7 3.5 - 5.0 g/dL   AST 20 15 - 41 U/L   ALT 10 (L) 14 - 54 U/L   Alkaline Phosphatase 77 38 - 126 U/L   Total Bilirubin 0.8 0.3 - 1.2 mg/dL   GFR calc non Af Amer >60 >60 mL/min   GFR calc Af Amer >60 >60 mL/min    Comment: (NOTE) The eGFR has been calculated using the CKD EPI equation. This calculation has not been validated in all clinical situations. eGFR's persistently <60 mL/min signify possible Chronic Kidney Disease.    Anion gap 11 5 - 15    Comment: Performed at [facility], [address] [place], [place] [address]  CBC     Status: Abnormal   Collection Time: 02/23/17  1:25 PM  Result Value Ref Range   WBC 11.7 (H) 3.6 - 11.0 K/uL   RBC 4.58 3.80 - 5.20 MIL/uL   Hemoglobin 13.8 12.0 - 16.0 g/dL   HCT 41.2 35.0 - 47.0 %   MCV 90.0 80.0 - 100.0 fL   MCH 30.2 26.0 - 34.0 pg    MCHC 33.5 32.0 - 36.0 g/dL   RDW 13.8 11.5 - 14.5 %   Platelets 274 150 - 440 K/uL    Comment: Performed at [facility],  [place], [place] [address]  Urinalysis, Complete w Microscopic     Status: Abnormal   Collection Time: 02/23/17  1:25 PM  Result Value Ref Range   Color, Urine STRAW (A) YELLOW   APPearance CLEAR (A) CLEAR   Specific Gravity, Urine 1.005 1.005 - 1.030   pH 8.0 5.0 - 8.0   Glucose, UA NEGATIVE NEGATIVE mg/dL   Hgb urine dipstick MODERATE (A) NEGATIVE   Bilirubin Urine NEGATIVE NEGATIVE   Ketones, ur NEGATIVE NEGATIVE mg/dL   Protein, ur NEGATIVE NEGATIVE mg/dL   Nitrite NEGATIVE NEGATIVE   Leukocytes, UA LARGE (A) NEGATIVE   RBC / HPF 0-5 0 - 5 RBC/hpf   WBC, UA 0-5 0 - 5 WBC/hpf   Bacteria, UA RARE (A) NONE SEEN   Squamous Epithelial / LPF 0-5 (A) NONE SEEN    Comment: Performed at [facility], [address], [place], [place] [address]  Urine culture     Status: Abnormal   Collection Time: 02/23/17  1:32 PM  Result Value Ref Range   Specimen Description      URINE, CLEAN CATCH Performed at [facility], [address], [place], [place] [address]    Special Requests      NONE Performed at [facility], [address] [place], [place] [address]    Culture (A)     <10,000 COLONIES/mL INSIGNIFICANT GROWTH Performed at [facility] 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  She does not have skin loss.  The pain is not debilitating.  We had a long discussion today regarding treatment options.  Of the most importance would be cold avoidance.   We discussed that that cannot always be the case, and trying to wear gloves and keep the hands warm is important.  If her symptoms become more severe, consideration for calcium channel blockers can be given.  At this time she does not want to take any medications and I do not think her symptoms are severe enough to warrant this.  She is young, healthy, without significant other disease processes so I do not think atherosclerosis is playing any role.  I will see her back as needed.     Leotis Pain 04/17/2017, 2:19 PM   This note was created with [facility] medical dictation system.  Any errors from dictation are unintentional.

## 2017-04-17 NOTE — Assessment & Plan Note (Signed)
The patient has symptomatic Raynaud's disease of the upper extremities.  She does not have skin loss.  The pain is not debilitating.  We had a long discussion today regarding treatment options.  Of the most importance would be cold avoidance.  We discussed that that cannot always be the case, and trying to wear gloves and keep the hands warm is important.  If her symptoms become more severe, consideration for calcium channel blockers can be given.  At this time she does not want to take any medications and I do not think her symptoms are severe enough to warrant this.  She is young, healthy, without significant other disease processes so I do not think atherosclerosis is playing any role.  I will see her back as needed.

## 2019-10-22 IMAGING — CT CT ABD-PELV W/ CM
2 of 4 series · 16 of 46 positions shown, 18 images · IV contrast (APPLIED)
Comparison: None.

CLINICAL DATA: Patient complains of RLQ pain since this AM. Mild
nausea, pain mod [DATE] at rlq. Never had this before. No prior hx of
ovarian cyst. No vaginal discharge. Period ended yesterday.

EXAM:
CT ABDOMEN AND PELVIS WITH CONTRAST
TECHNIQUE: Multidetector CT imaging of the abdomen and pelvis was performed
using the standard protocol following bolus administration of
intravenous contrast.
CONTRAST:  75mL UAFKB1-CXX IOPAMIDOL (UAFKB1-CXX) INJECTION 61%

[Series 2: routine abd/pel with · axial · 0.67mm/px · z∈[+288,+658]mm · 13 of 82 slices shown, 15 images]
[im 4/82  soft-tissue]
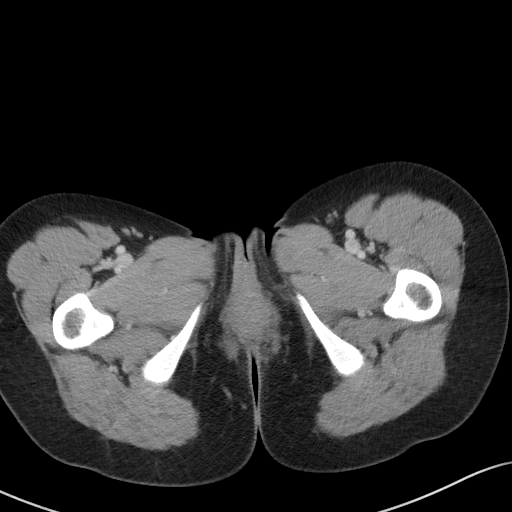
[im 4/82  bone]
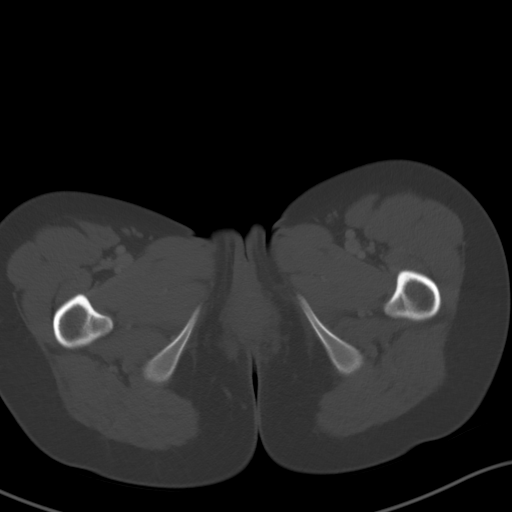
[im 10/82  soft-tissue]
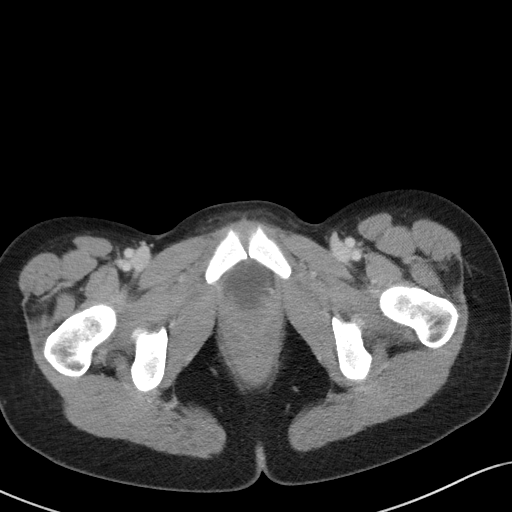
[im 17/82  soft-tissue]
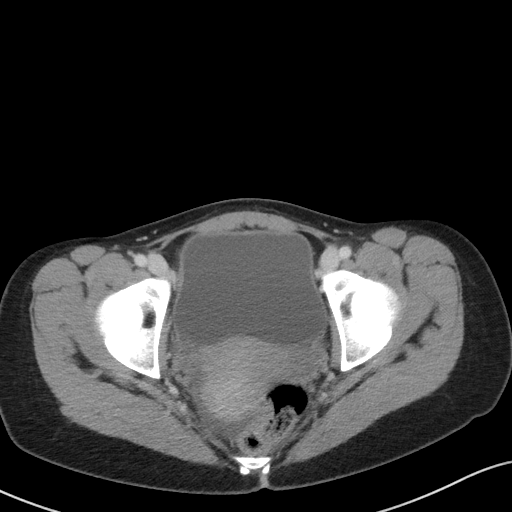
[im 23/82  soft-tissue]
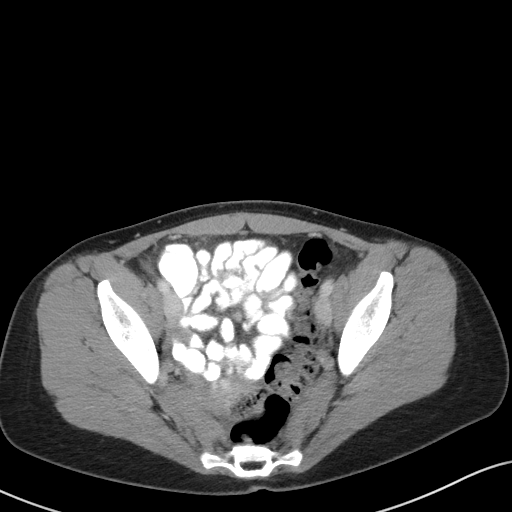
[im 30/82  soft-tissue]
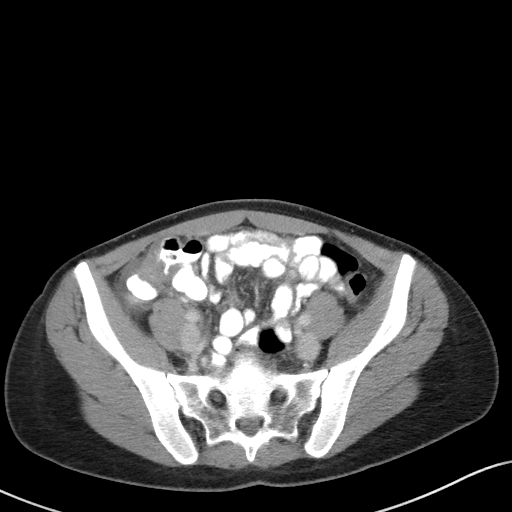
[im 36/82  soft-tissue]
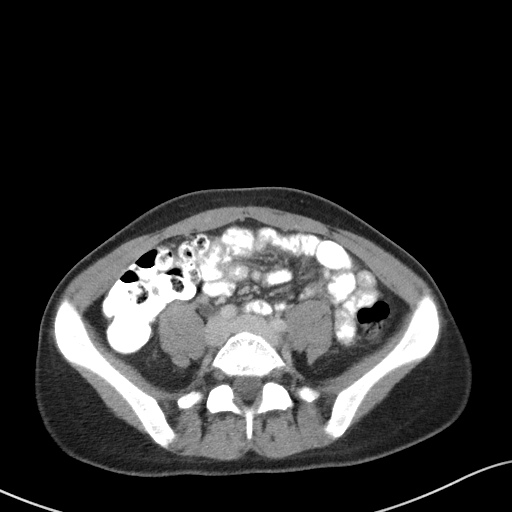
[im 43/82  soft-tissue]
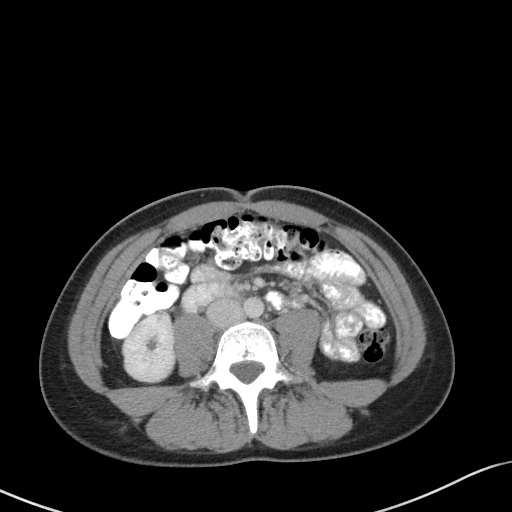
[im 46/82  soft-tissue]
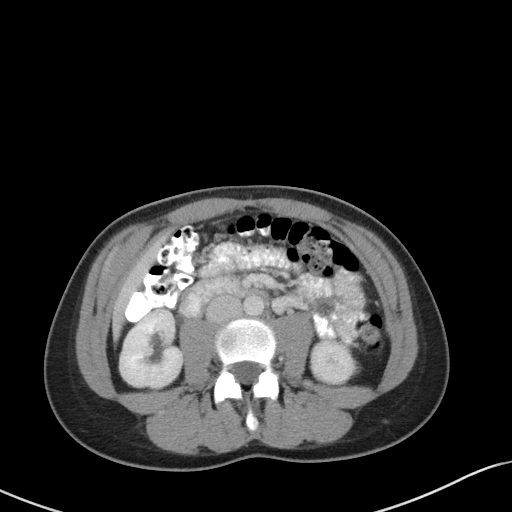
[im 52/82  soft-tissue]
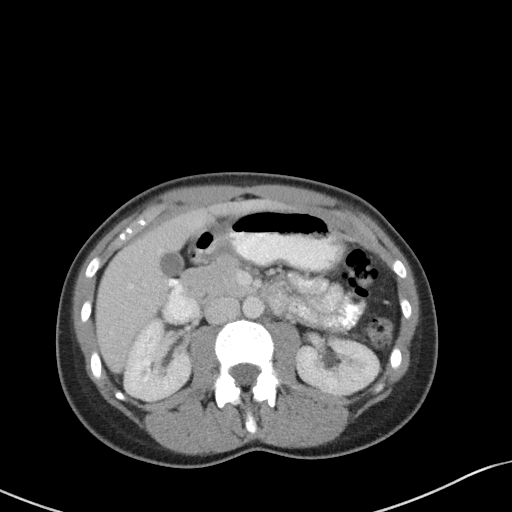
[im 52/82  bone]
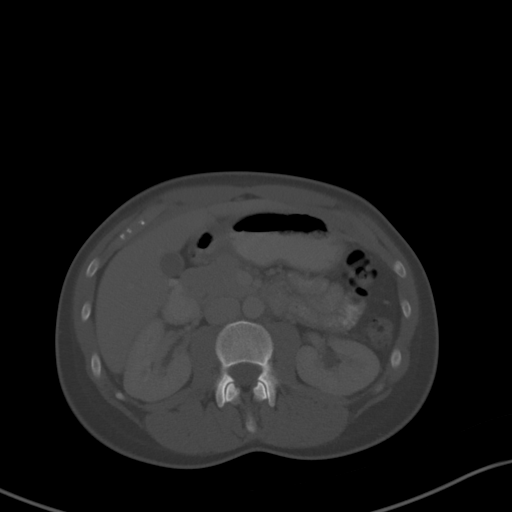
[im 59/82  soft-tissue]
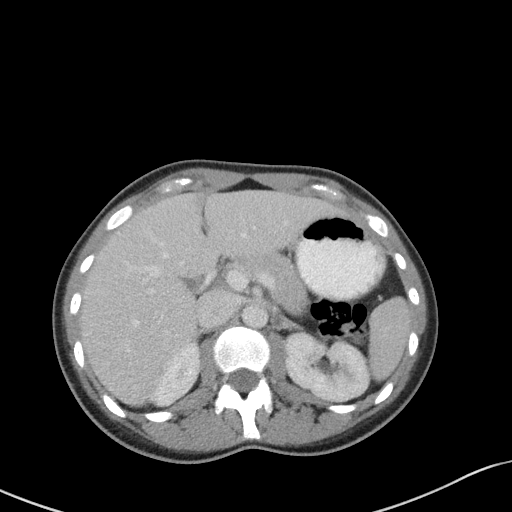
[im 65/82  soft-tissue]
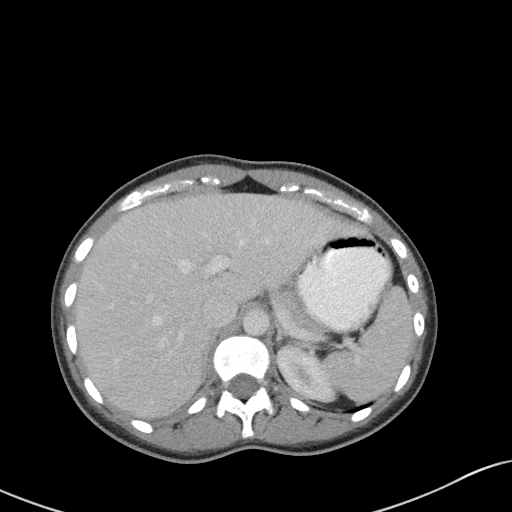
[im 72/82  soft-tissue]
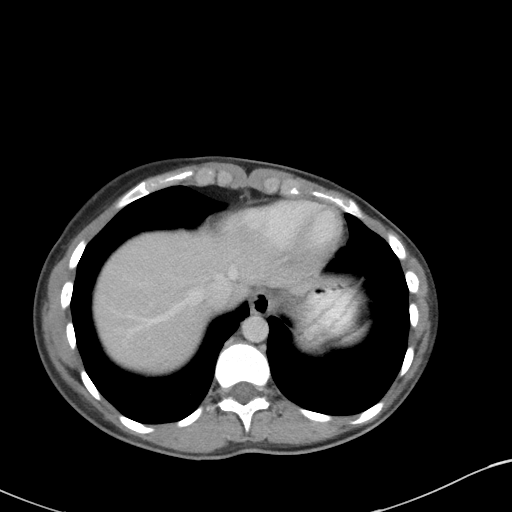
[im 78/82  soft-tissue]
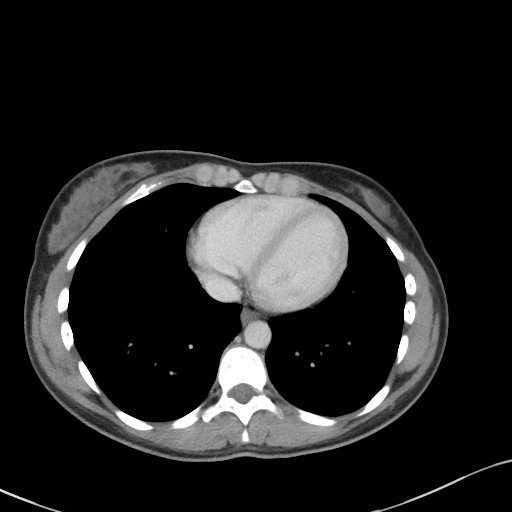

[Series 5: coronal st · coronal · 0.66mm/px · 3 of 69 slices shown]
[im 23/69  soft-tissue]
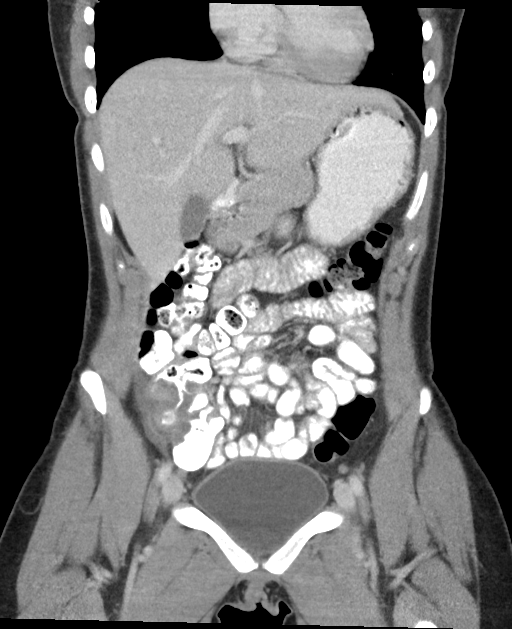
[im 31/69  soft-tissue]
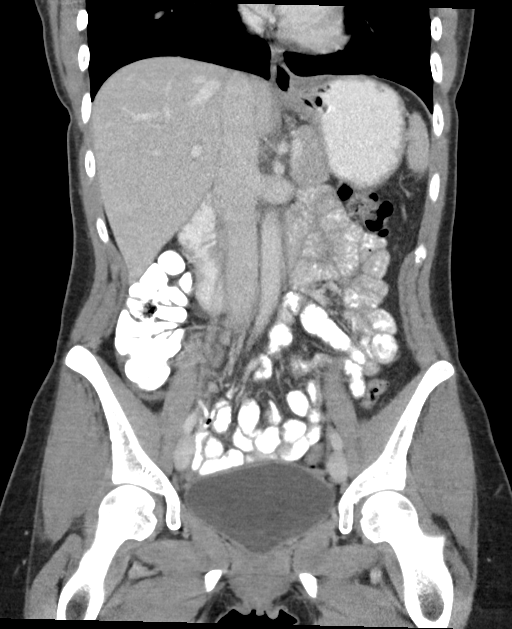
[im 38/69  soft-tissue]
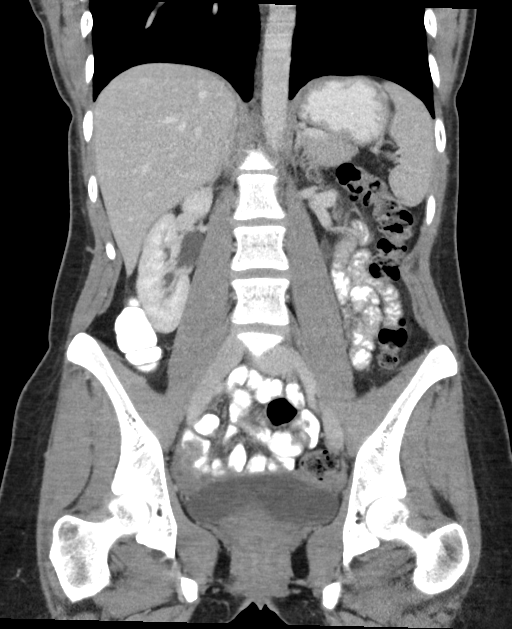

[16 of 46 positions shown; findings below may reference images not displayed]

FINDINGS: Lower chest: No acute abnormality.

Hepatobiliary: No focal liver abnormality is seen. No gallstones,
gallbladder wall thickening, or biliary dilatation.

Pancreas: Unremarkable. No pancreatic ductal dilatation or
surrounding inflammatory changes.

Spleen: Normal in size without focal abnormality.

Adrenals/Urinary Tract: Adrenal glands are unremarkable. Kidneys are
normal, without renal calculi, focal lesion, or hydronephrosis.
Bladder is unremarkable.

Stomach/Bowel: Stomach is incompletely distended. Small bowel is
nondilated. Terminal ileum unremarkable. Normal appendix. There is
eccentric wall thickening in the proximal ascending colon with some
surrounding inflammatory/edematous change. No evidence of abscess.
The remainder of the colon is unremarkable.

Vascular/Lymphatic: No significant vascular findings are present. No
enlarged abdominal or pelvic lymph nodes.

Reproductive: Uterus and bilateral adnexa are unremarkable.

Other: Trace pelvic ascites probably physiologic.  No free air.

Musculoskeletal: No acute or significant osseous findings.
IMPRESSION: 1. Focal eccentric wall thickening involving proximal ascending
colon with adjacent inflammatory/edematous change. Considerations
include inflammatory bowel disease, infectious/inflammatory process,
less likely neoplasm. Consider colonoscopy if symptoms persist.
2. Normal appendix.

The
